# Patient Record
Sex: Female | Born: 1970 | Hispanic: No | Smoking: Never smoker
Health system: Southern US, Community
[De-identification: ages and names within clinical notes are randomized; demographics above are authoritative.]

## PROBLEM LIST (undated history)

## (undated) DIAGNOSIS — I10 Essential (primary) hypertension: Secondary | ICD-10-CM

---

## 2003-09-20 ENCOUNTER — Other Ambulatory Visit: Admission: RE | Admit: 2003-09-20 | Discharge: 2003-09-20 | Payer: Self-pay | Admitting: Obstetrics and Gynecology

## 2012-11-26 ENCOUNTER — Other Ambulatory Visit: Payer: Self-pay | Admitting: Obstetrics & Gynecology

## 2012-11-26 DIAGNOSIS — Z1231 Encounter for screening mammogram for malignant neoplasm of breast: Secondary | ICD-10-CM

## 2012-12-14 ENCOUNTER — Ambulatory Visit (HOSPITAL_COMMUNITY): Payer: BC Managed Care – PPO

## 2013-08-29 ENCOUNTER — Other Ambulatory Visit: Payer: Self-pay | Admitting: *Deleted

## 2013-08-29 DIAGNOSIS — N390 Urinary tract infection, site not specified: Secondary | ICD-10-CM

## 2013-08-29 MED ORDER — SULFAMETHOXAZOLE-TMP DS 800-160 MG PO TABS: 1.0000 | ORAL_TABLET | Freq: Two times a day (BID) | ORAL | Status: AC

## 2013-09-01 ENCOUNTER — Other Ambulatory Visit: Payer: Self-pay | Admitting: Obstetrics & Gynecology

## 2013-09-01 DIAGNOSIS — Z1231 Encounter for screening mammogram for malignant neoplasm of breast: Secondary | ICD-10-CM

## 2013-09-06 ENCOUNTER — Ambulatory Visit (HOSPITAL_COMMUNITY)
Admission: RE | Admit: 2013-09-06 | Discharge: 2013-09-06 | Disposition: A | Discharge status: Home / Self Care | Payer: BC Managed Care – PPO | Source: Ambulatory Visit | Attending: Obstetrics & Gynecology | Admitting: Obstetrics & Gynecology

## 2013-09-06 DIAGNOSIS — Z1231 Encounter for screening mammogram for malignant neoplasm of breast: Secondary | ICD-10-CM | POA: Insufficient documentation

## 2014-04-29 IMAGING — MG MM 3D TOMO
6 of 10 series · 6 of 26 positions shown · non-contrast
Comparison: None.

CLINICAL DATA: Screening.

DIGITAL SCREENING BILATERAL MAMMOGRAM WITH 3D TOMO WITH CAD

[R MLO (1 of 2)]
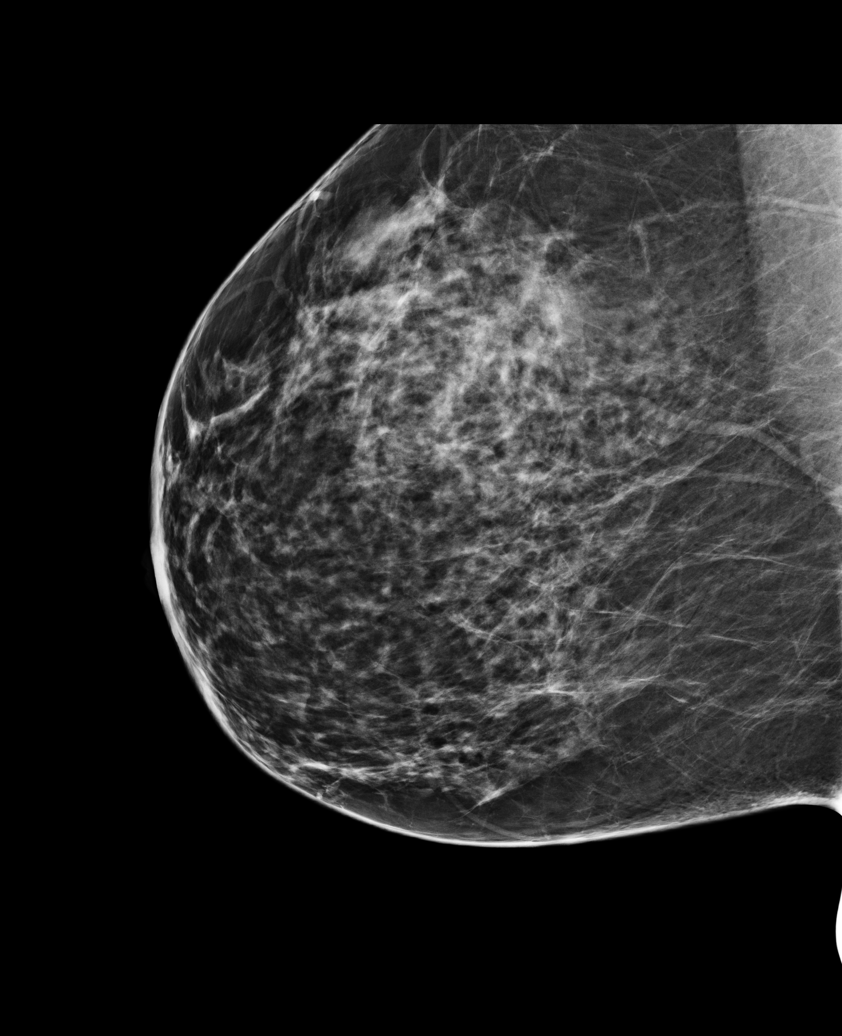

[L MLO (1 of 2)]
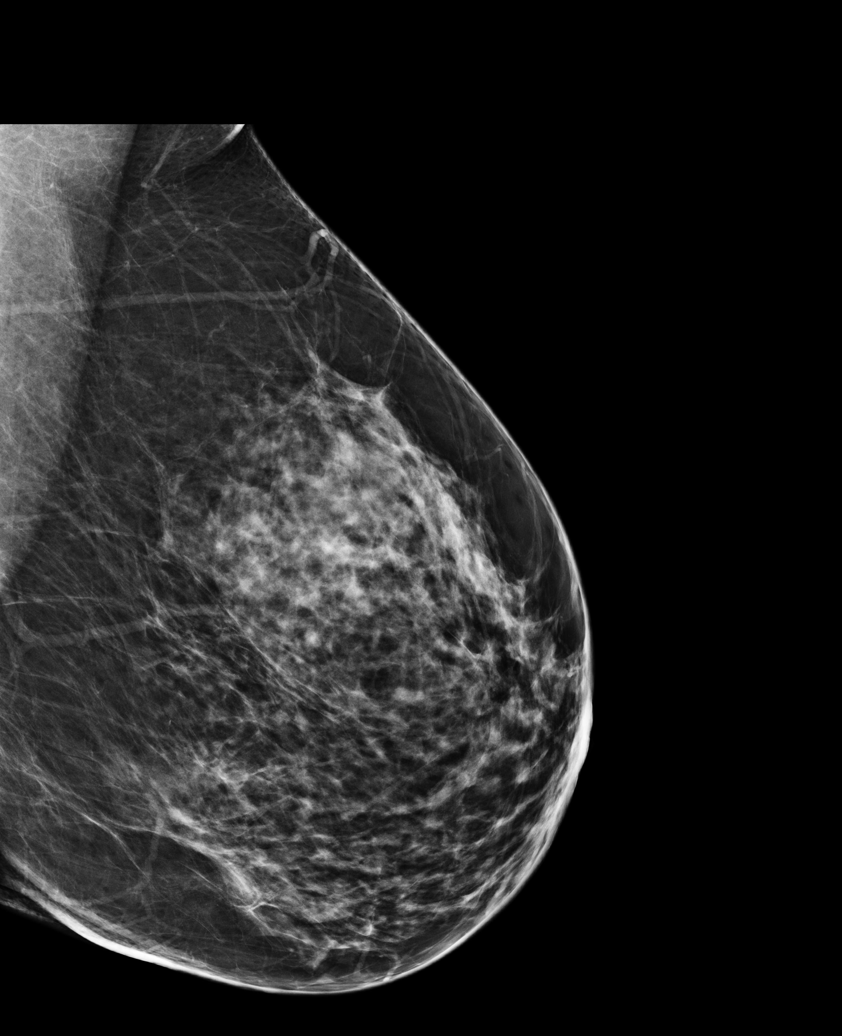

[R CC]
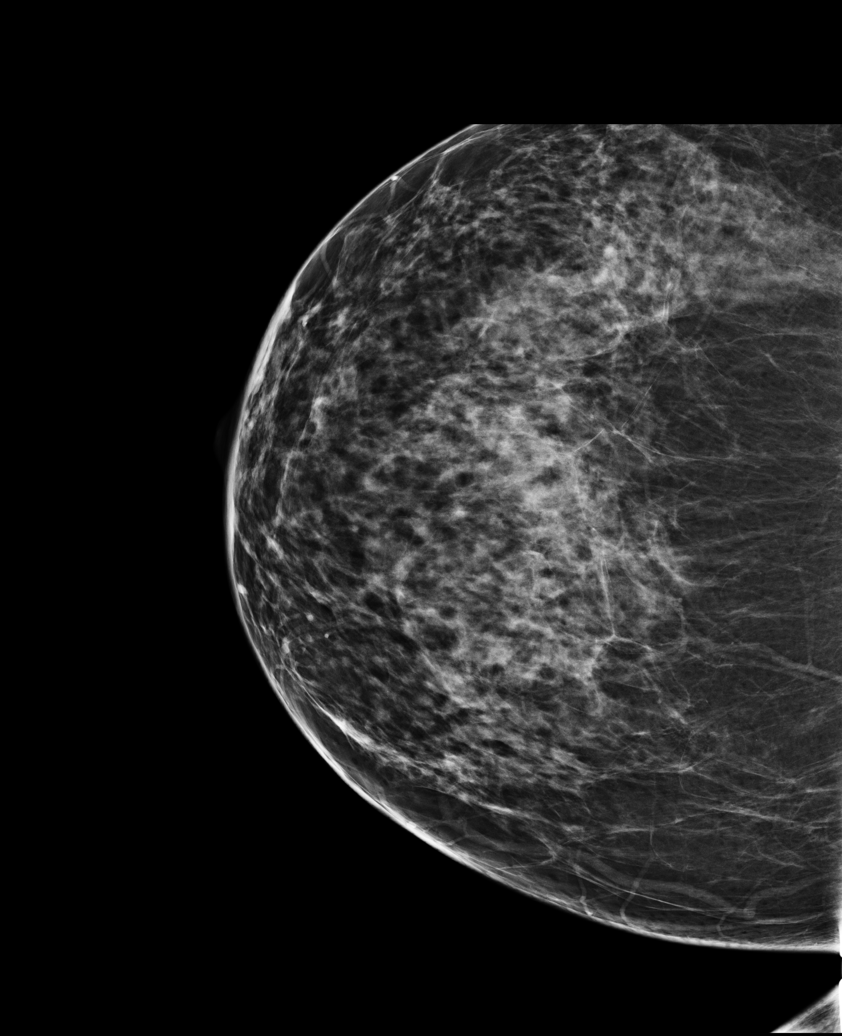

[R MLO (2 of 2)]
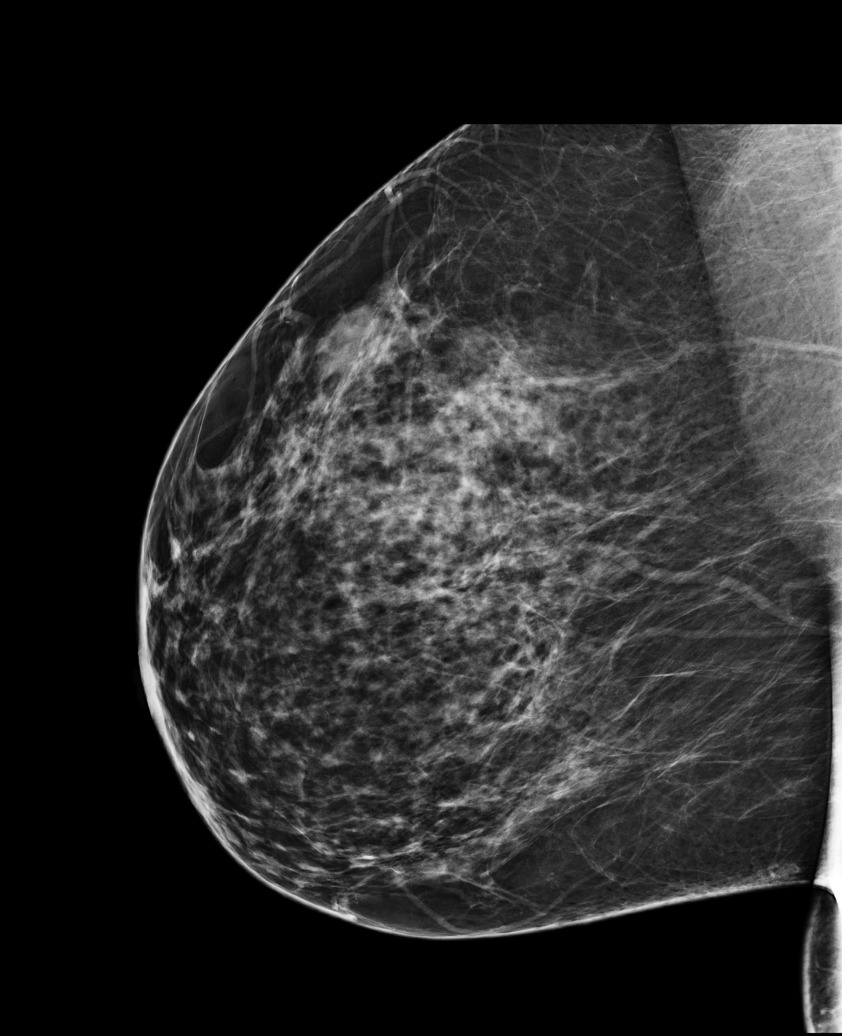

[L MLO (2 of 2)]
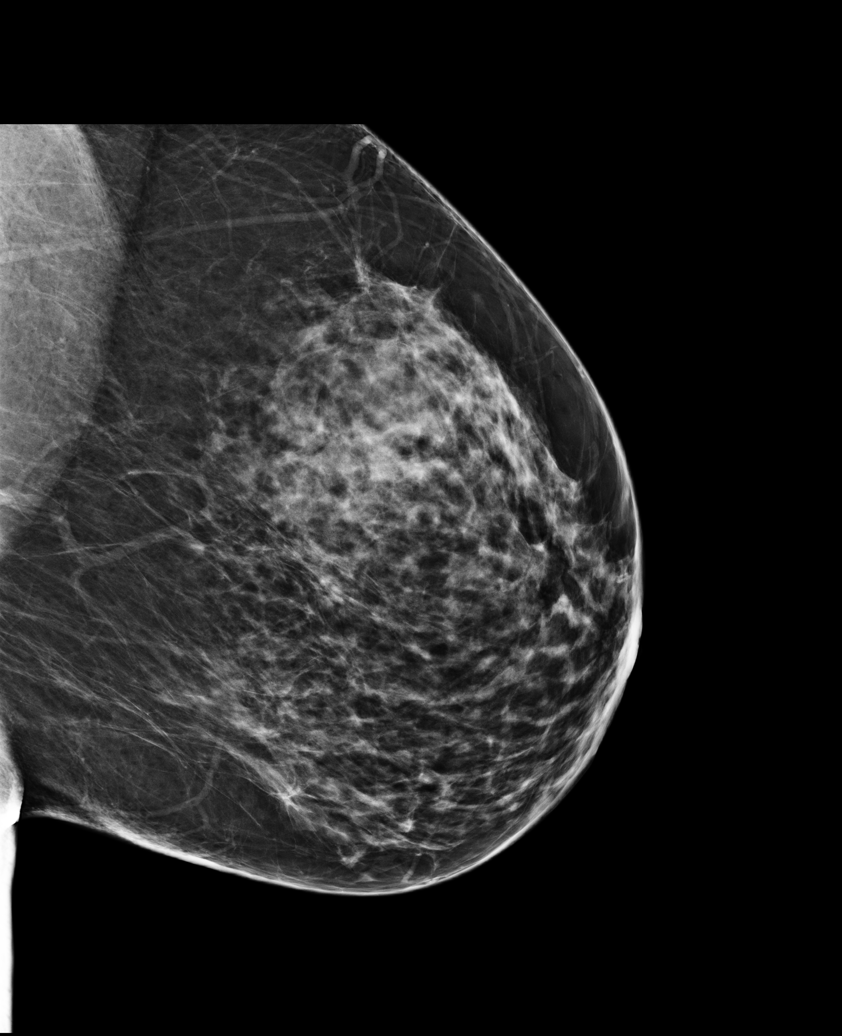

[L CC]
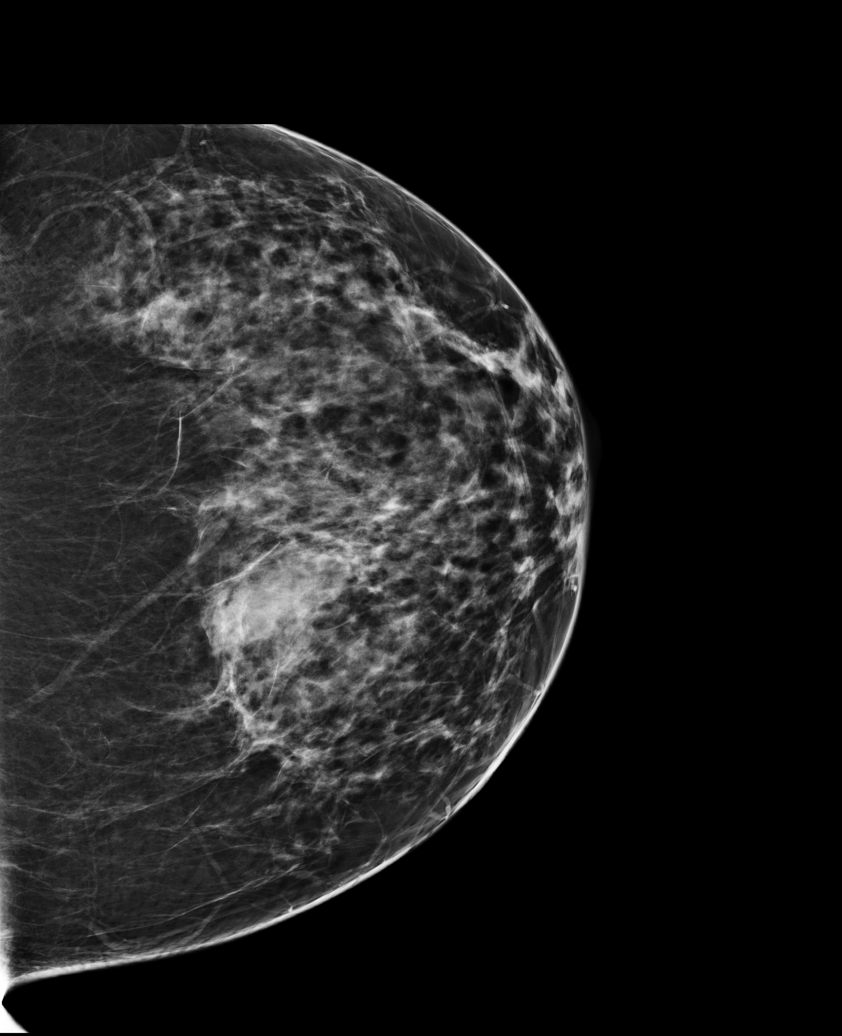

[6 of 26 positions shown; findings below may reference images not displayed]

FINDINGS: ACR Breast Density Category c:  The breast tissue is
heterogeneously dense, which may obscure small masses.

There is no suspicious dominant mass, architectural distortion, or
calcification to suggest malignancy. There is an oval mass of fatty
and soft tissue density in the left upper outer quadrant consistent
with a hamartoma (fibroadenolipoma).

Images were processed with CAD.
IMPRESSION: No mammographic evidence of malignancy.

A result letter of this screening mammogram will be mailed directly
to the patient.

RECOMMENATION:
Screening mammogram in one year. (Code:XD-5-FRQ)

BI-RADS CATEGORY 2:  Benign finding(s).

## 2014-10-12 ENCOUNTER — Other Ambulatory Visit (HOSPITAL_COMMUNITY): Payer: Self-pay | Admitting: Family Medicine

## 2014-10-12 DIAGNOSIS — Z1231 Encounter for screening mammogram for malignant neoplasm of breast: Secondary | ICD-10-CM

## 2014-11-09 ENCOUNTER — Ambulatory Visit (HOSPITAL_COMMUNITY)
Admission: RE | Admit: 2014-11-09 | Discharge: 2014-11-09 | Disposition: A | Discharge status: Home / Self Care | Payer: BC Managed Care – PPO | Source: Ambulatory Visit | Attending: Family Medicine | Admitting: Family Medicine

## 2014-11-09 DIAGNOSIS — Z1231 Encounter for screening mammogram for malignant neoplasm of breast: Secondary | ICD-10-CM | POA: Insufficient documentation

## 2017-03-11 ENCOUNTER — Encounter (HOSPITAL_COMMUNITY): Payer: Self-pay | Admitting: Emergency Medicine

## 2017-03-11 ENCOUNTER — Ambulatory Visit (HOSPITAL_COMMUNITY)
Admission: EM | Admit: 2017-03-11 | Discharge: 2017-03-11 | Disposition: A | Discharge status: Home / Self Care | Payer: Worker's Compensation

## 2017-03-11 DIAGNOSIS — G5601 Carpal tunnel syndrome, right upper limb: Secondary | ICD-10-CM | POA: Diagnosis not present

## 2017-03-11 HISTORY — DX: Essential (primary) hypertension: I10

## 2017-03-11 MED ORDER — NAPROXEN 500 MG PO TABS: 500.0000 mg | ORAL_TABLET | Freq: Two times a day (BID) | ORAL | 0 refills | Status: AC

## 2017-03-11 MED ORDER — IBUPROFEN 800 MG PO TABS: 800.0000 mg | ORAL_TABLET | Freq: Once | ORAL | Status: DC

## 2017-03-11 NOTE — ED Provider Notes (Signed)
CSN: [id number]     Arrival date & time 03/11/17  1647 History   First MD Initiated Contact with Patient 03/11/17 1809     Chief Complaint  Patient presents with  . Arm Pain   (Consider location/radiation/quality/duration/timing/severity/associated sxs/prior Treatment) Patient was at work typing and felt pain in her right wrist.   The history is provided by the patient.  Arm Pain  This is a new problem. The problem occurs constantly. The problem has not changed since onset.   Past Medical History:  Diagnosis Date  . Hypertension    History reviewed. No pertinent surgical history. No family history on file. Social History  Substance Use Topics  . Smoking status: Never Smoker  . Smokeless tobacco: Not on file  . Alcohol use No   OB History    No data available     Review of Systems  Constitutional: Negative.   HENT: Negative.   Eyes: Negative.   Respiratory: Negative.   Cardiovascular: Negative.   Gastrointestinal: Negative.   Endocrine: Negative.   Genitourinary: Negative.   Musculoskeletal: Positive for arthralgias.  Allergic/Immunologic: Negative.   Neurological: Negative.   Hematological: Negative.   Psychiatric/Behavioral: Negative.     Allergies  Patient has no known allergies.  Home Medications   Prior to Admission medications   Medication Sig Start Date End Date Taking? Authorizing Provider  amLODipine (NORVASC) 5 MG tablet Take 5 mg by mouth daily.   Yes Historical Provider, MD  lansoprazole (PREVACID) 15 MG capsule Take 15 mg by mouth daily at 12 noon.   Yes Historical Provider, MD  metoprolol succinate (TOPROL-XL) 100 MG 24 hr tablet Take 100 mg by mouth daily. Take with or immediately following a meal.   Yes Historical Provider, MD  norethindrone-ethinyl estradiol (JUNEL FE,GILDESS FE,LOESTRIN FE) 1-20 MG-MCG tablet Take 1 tablet by mouth daily.   Yes Historical Provider, MD  naproxen (NAPROSYN) 500 MG tablet Take 1 tablet (500 mg total) by mouth 2  (two) times daily with a meal. 03/11/17   Deatra Canter, FNP  sulfamethoxazole-trimethoprim (BACTRIM DS) 800-160 MG per tablet Take 1 tablet by mouth 2 (two) times daily. 08/29/13   Antionette Char, MD   Meds Ordered and Administered this Visit  Medications - No data to display  BP (!) 146/85 (BP Location: Left Arm)   Pulse 72   Temp 98.9 F (37.2 C) (Oral)   Resp 18   LMP 02/23/2017   SpO2 100%  No data found.   Physical Exam  Constitutional: She appears well-developed and well-nourished.  HENT:  Head: Normocephalic and atraumatic.  Eyes: Conjunctivae and EOM are normal. Pupils are equal, round, and reactive to light.  Cardiovascular: Normal rate, regular rhythm and normal heart sounds.   Pulmonary/Chest: Effort normal and breath sounds normal.  Musculoskeletal: She exhibits tenderness.  Positive Phalens and Tinnels right wrist  Nursing note and vitals reviewed.   Urgent Care Course     Procedures (including critical care time)  Labs Review Labs Reviewed - No data to display  Imaging Review No results found.   Visual Acuity Review  Right Eye Distance:   Left Eye Distance:   Bilateral Distance:    Right Eye Near:   Left Eye Near:    Bilateral Near:         MDM   1. Carpal tunnel syndrome of right wrist    Naprosyn  one po bid x 10 days Right wrist splint  Note to return to work w/o restrictions  tomorrow Follow up with Occupational Health      Deatra Canter, FNP 03/11/17 [phone]

## 2017-03-11 NOTE — ED Triage Notes (Signed)
Right forearm, wrist, and hand pain.  Reports sharp pain in wrist shooting into hand and is more constant than she has experienced before now.  Patient was typing on computer and moving mouse when she noticed pain.  Onset of pain while at work.  Patient reports writing with a pencil, she feels cramping in hand.  Able to move fingers without difficulty

## 2019-06-14 ENCOUNTER — Other Ambulatory Visit: Payer: Self-pay | Admitting: *Deleted

## 2019-06-14 DIAGNOSIS — Z20822 Contact with and (suspected) exposure to covid-19: Secondary | ICD-10-CM

## 2019-06-19 LAB — NOVEL CORONAVIRUS, NAA: SARS-CoV-2, NAA: NOT DETECTED
